# Patient Record
Sex: Female | Born: 1945 | Race: White | Hispanic: No | Marital: Married | Smoking: Never smoker
Health system: Southern US, Community
[De-identification: ages and names within clinical notes are randomized; demographics above are authoritative.]

## PROBLEM LIST (undated history)

## (undated) DIAGNOSIS — I2510 Atherosclerotic heart disease of native coronary artery without angina pectoris: Secondary | ICD-10-CM

## (undated) DIAGNOSIS — I4891 Unspecified atrial fibrillation: Secondary | ICD-10-CM

## (undated) DIAGNOSIS — E079 Disorder of thyroid, unspecified: Secondary | ICD-10-CM

---

## 2007-01-14 ENCOUNTER — Inpatient Hospital Stay (HOSPITAL_COMMUNITY): Admission: EM | Admit: 2007-01-14 | Discharge: 2007-01-16 | Payer: Self-pay | Admitting: Emergency Medicine

## 2008-03-06 ENCOUNTER — Ambulatory Visit: Payer: Self-pay

## 2009-06-20 ENCOUNTER — Ambulatory Visit: Payer: Self-pay | Admitting: Family Medicine

## 2009-07-06 IMAGING — CR DG ELBOW COMPLETE 3+V*R*
3 series · 3 of 3 positions shown · non-contrast
Comparison: none

CLINICAL DATA: Fall.  Pain.
 RIGHT ELBOW ? 4 VIEWS ? 01/14/07:

[x elbow joint lat right]
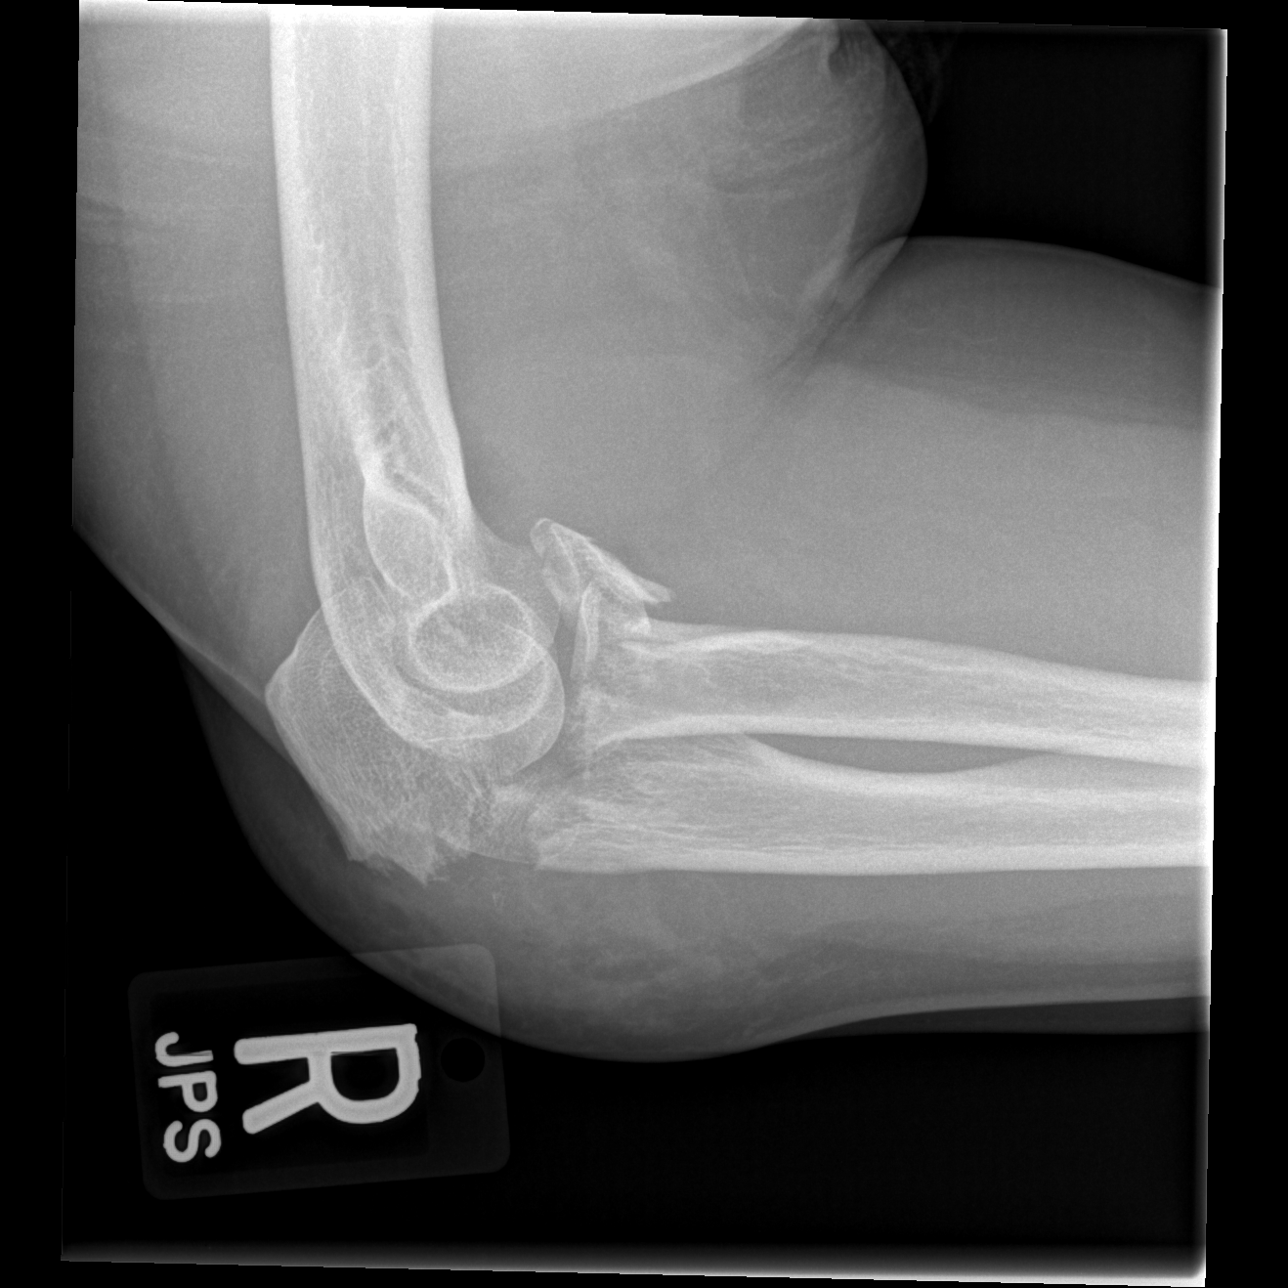

[x elbow joint ap right]
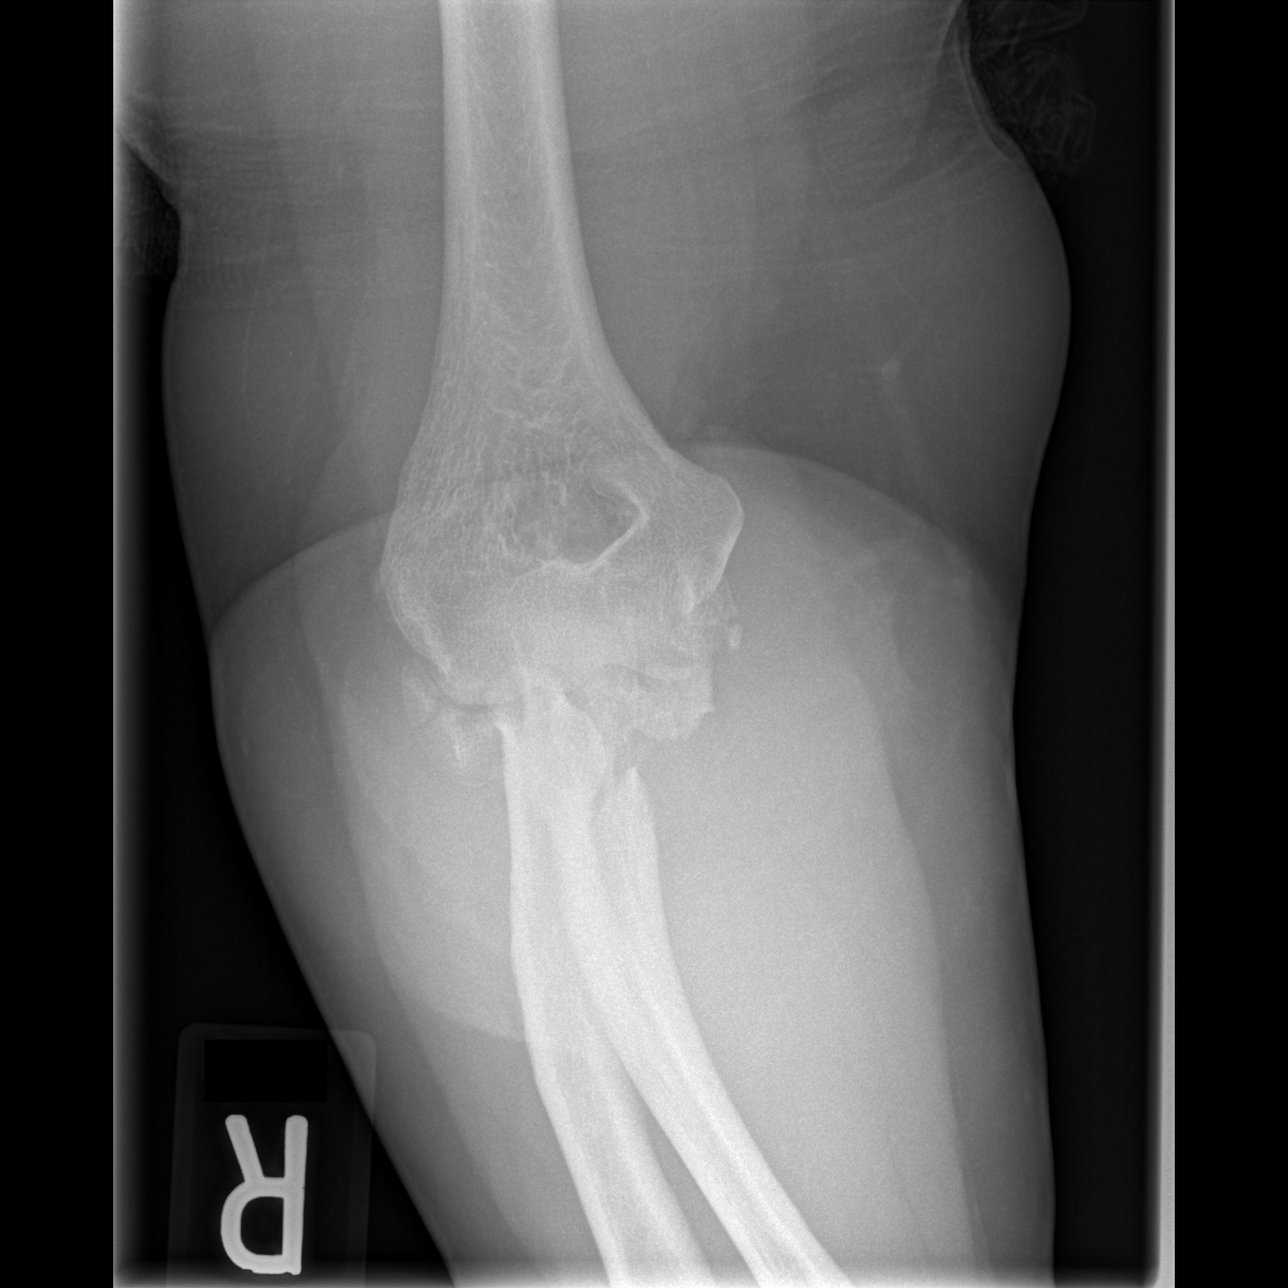

[x elbow joint obl. right]
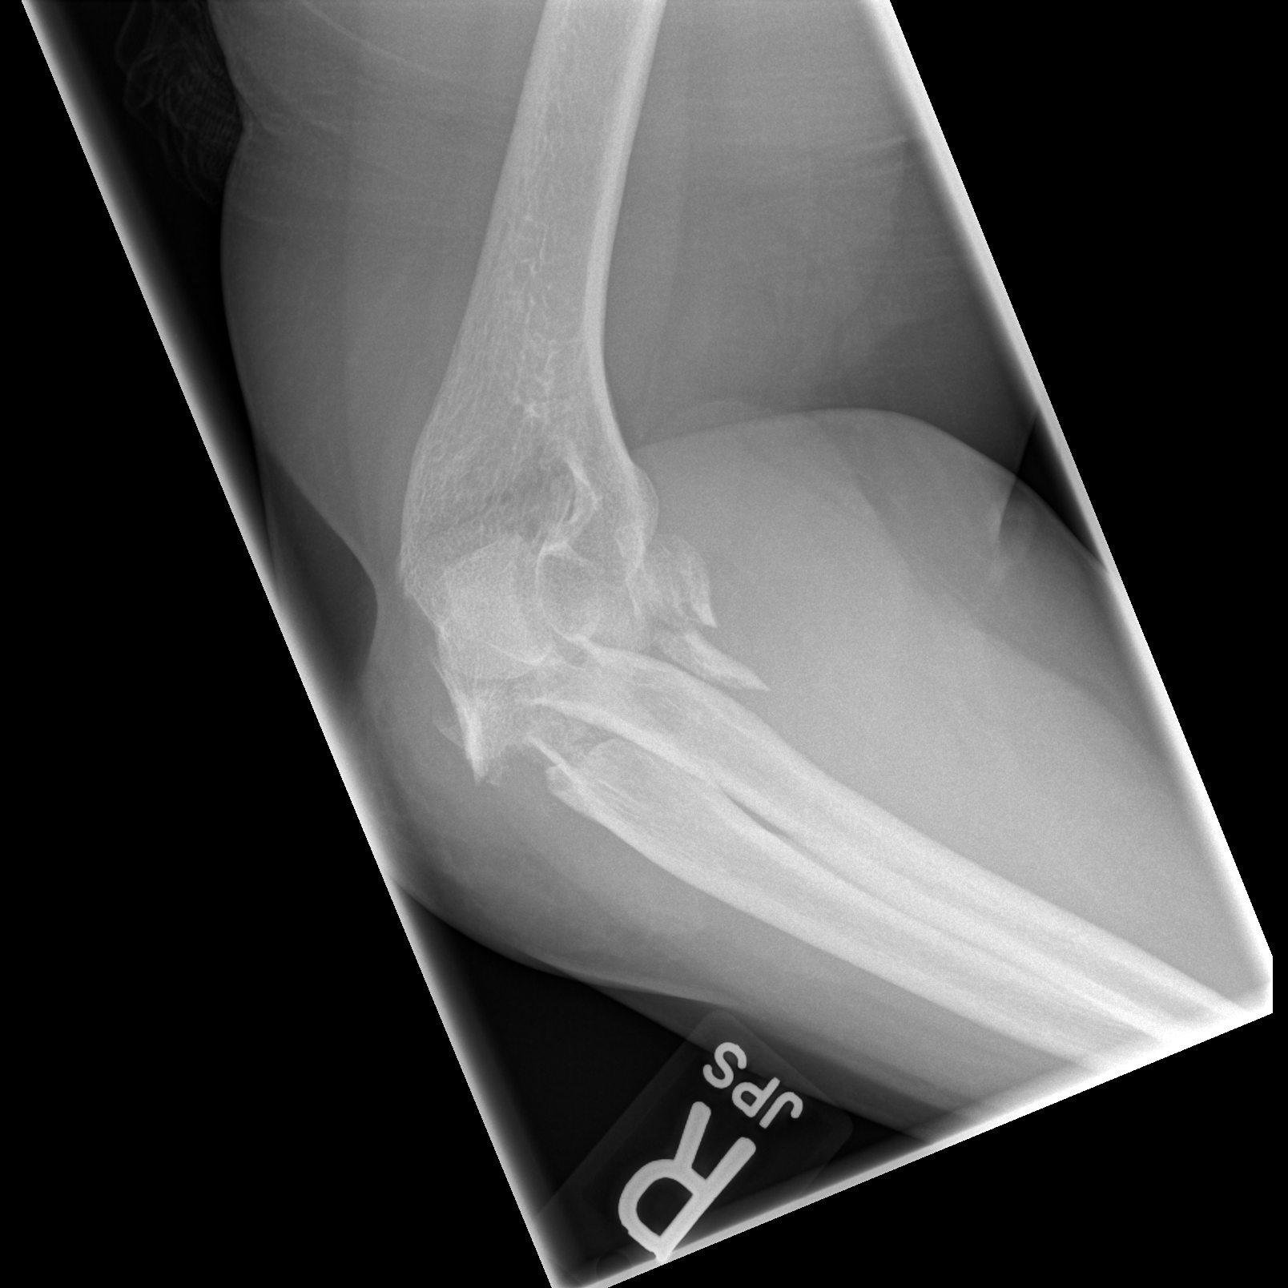

[3 of 3 positions shown; findings below may reference images not displayed]

FINDINGS: There are markedly comminuted fractures of the proximal right ulna and radius.  There is approximately 11 mm of distraction associated with the comminuted ulnar fracture.  The coronoid process of the ulna is displaced anteriorly with respect to the ulnar shaft.  In addition, the radial head fracture is markedly comminuted and impacted.
IMPRESSION: Markedly comminuted fractures of the proximal right radius and ulna with displacement as discussed above.

## 2010-05-24 NOTE — Discharge Summary (Signed)
[name], [name]                ACCOUNT NO.:  [id number]   MEDICAL RECORD NO.:  [id number]          PATIENT TYPE:  INP   LOCATION:  5037                         FACILITY:  [facility]   PHYSICIAN:  Madelynn Done, MD  DATE OF BIRTH:  [birth year]   DATE OF ADMISSION:  01/14/2007  DATE OF DISCHARGE:  01/16/2007                               DISCHARGE SUMMARY   ADMISSION DIAGNOSIS:  Right elbow, proximal ulna and proximal radius  fracture.   DISCHARGE DIAGNOSES:  1. Right proximal radius and ulna fracture.  2. Hypothyroidism.  3. Anxiety.   PROCEDURES AND DATES:  Open reduction and internal fixation right  proximal olecranon and radial head arthroplasty on January 14, 2007.   DISCHARGE MEDICATIONS:  1. Percocet 1-2 tablets p.o. q.4-6 hours p.r.n. pain.  2. Colace 100 mg p.o. b.i.d.  3. Multivitamin 1 tablet p.o. daily.  4. Phenergan 12.5 mg p.o. q.6 p.r.n. nausea.   REASON FOR ADMISSION:  The patient is a 65 year old female who was at  work and tripped and fell and landed on her right elbow.  She sustained  a significant injury to her right elbow with a fracture of the right  proximal olecranon, coronoid, and radial head.  The patient was admitted  for pain control and to undergo the above procedure.   HOSPITAL COURSE:  The patient was taken to the operating room on the day  of admission.  She underwent the above procedure under general  anesthesia.  She tolerated it well.  She was admitted postoperatively to  the orthopedic floor where she was begun on IV pain medications and IV  antibiotics.  The patient was examined throughout her hospital course  and noted to remain afebrile.  Her vital signs remained stable and  normal.  She was up ambulating without assistive devices.  Her pain was  controlled on oral pain medications.  She was tolerating a regular diet  and felt ready to be discharged to home on postoperative day #2.   RECOMMENDATIONS/DISPOSITION:  The patient will be  discharged to home  with her husband.  She is to continue with the above discharge  medications.  She is going to be seen back in the office in 12 days for  wound check, likely staple removal and placed into a long-arm cast.  I  plan to see her back.  She was given my contact information.  If she has  any problems, she is going to come back in sooner.  She has no use of  the right upper  extremity.  No return to work until I see her back in the office while  she is on the narcotic pain medications.  No driving, no lifting.  Prior  to her discharge, the patient's discharge instructions were explained to  her, her questions were answered, and the patient voiced understanding  the discharge instructions.      Madelynn Done, MD  Electronically Signed     FWO/MEDQ  D:  01/16/2007  T:  01/16/2007  Job:  [id number]

## 2010-05-24 NOTE — Op Note (Signed)
[name], [name]                ACCOUNT NO.:  [id number]   MEDICAL RECORD NO.:  [id number]          PATIENT TYPE:  INP   LOCATION:  5037                         FACILITY:  [facility]   PHYSICIAN:  Madelynn Done, MD  DATE OF BIRTH:  [birth year]   DATE OF PROCEDURE:  01/14/2007  DATE OF DISCHARGE:                               OPERATIVE REPORT   PREOPERATIVE DIAGNOSES:  1. Right elbow proximal ulna fracture.  2. Right ulna coronoid fracture.  3. Right radial head comminuted fracture, Mason type III fracture.  4. Monteggia type variant proximal elbow fracture   POSTOPERATIVE DIAGNOSES:  1. Right elbow proximal ulna fracture.  2. Right ulna coronoid fracture.  3. Right radial head comminuted fracture, Mason type III fracture.  4. Monteggia type variant proximal elbow fracture   ATTENDING SURGEON:  Dr. Bradly Bienenstock who was scrubbed and present for  the entire procedure.   ASSISTANT SURGEON:  Dr. Venita Lick who was scrubbed and present for  the key portions of the procedure.   SURGICAL PROCEDURES:  1. Open treatment of Monteggia type variant proxima ulna fracture with      internal fixation, internal fixation of proximal ulna and coronoid      fracture.  2. Right elbow radial head arthroplasty.  3. Right elbow ulnar nerve neurolysis, neuroplasty and decompression.  4. Stress radiography, three views, of right elbow.   ANESTHESIA:  General via endotracheal tube.   TOURNIQUET TIME:  2 hours at 250 mmHg.   IMPLANTS:  1. Acumed radial head arthroplasty; 26 mm, +0 head and 7-mm stem.  2. Acumed Right ulnar proximal olecranon plate; nine-hole locking      plate.   SURGICAL INDICATIONS:  [name] is a 65 year old right-hand-dominant  female who fell and landed on an outstretched right elbow.  She was seen  and evaluated in the emergency department and noted to have a displaced  proximal olecranon fracture of the proximal metaphysis as well as a  fracture of the coronoid  and comminuted displaced radial head fracture.  Given her injuries, it was recommended that she undergo surgical  fixation of her fractures.  I outlined the procedure with her.  I  discussed the risks of surgery to include, but not limited to bleeding,  infection, nerve damage, nonunion, malunion, hardware failure, loss of  motion of the elbow, need for further surgical intervention, implant  loosening, and persistent pain in the elbow.  A signed informed consent  was obtained on the day of surgery.   INTRAOPERATIVE FINDINGS:  The patient did have a comminuted radial head  fracture with 5 or more fragments.  It was not felt amenable to open  reduction and internal fixation, but more for radial head arthroplasty.  She did have a coronoid fracture.  There were several fracture lines  within the coronoid fracture involving the articular cartilage.  The  ulnar nerve was identified and protected throughout the entire case, but  it was felt that after the end of the case that it was not necessary to  transpose the nerve anteriorly.  There was not  any significant tension  on the nerve following repair of the fracture.   DESCRIPTION OF PROCEDURE:  The patient was identified in the preop  holding area and a mark with a permanent marker was made on the right  elbow to indicate the correct operative site.  The patient was then  brought back to the operating room and placed supine on the anesthesia  room table and general anesthesia was administered via endotracheal  tube.  The patient received preoperative antibiotics.  The patient was  then, using a beanbag positioner, placed in a lazy lateral position.  Sterile Foley catheter had been placed prior.  SCDs were placed for  mechanical DVT prophylaxis.  The patient was placed in the lazy lateral  position.  All pressure points were well padded.  After adequate  positioning, the right upper extremity was then sealed with a U drape  and then prepped  and draped in the usual sterile fashion.  A sterile  tourniquet was then applied.  The correct site was identified and the  surgical procedure was then begun.   The limb was then elevated using Esmarch exsanguination and tourniquet  insufflated to 250 mmHg.  Using a standard posterior approach to the  elbow and curving radially around the olecranon tip, dissection was  carried down through the skin and subcutaneous tissue and all the way  down to bone to expose the fracture site.  The fracture hematoma was  debrided of the olecranon and the patient did have  a significant soft  tissue envelope injury involving a portion of the common extensor muscle  off the lateral epicondyle as well as the anconeus.  This allowed for  good visualization of the proximal radioulnar joint as well as the  radial head.  Following exposure of the elbow joint, attention was first  turned to the coronoid fragment.  Two large #2-0 FiberWire sutures were  then placed after drill holes were made in the coronoid and then placed  in order to pull the coronoid from an anterior-to-posterior direction.  Portions of the capsule were included in these 2 large sutures.  These  sutures were then brought down and a trial reduction of the olecranon  was done.  The appeared to be good position of the coronoid with the  FiberWire suture in place.  After the sutures were then placed, the  ulnar nerve was in very close proximity to the coronoid fragment as it  traced medially from along the medial portion of the olecranon.  This  nerve was then decompressed distally and freed up to the edge of the  cubital tunnel.  It was not transposed anteriorly.  Following the  neurolysis of the ulnar nerve and temporary fixation of the coronoid,  attention was then turned to the radial head.  The radial head was in  more than 5 pieces and this was taken off to the back table and it was  felt that the radial head was amenable to radial head  arthroplasty.  The  canal was then entered.  The canal was then prepared in 1-mm increments  with the 6-mm and 7-mm canal preparers.  Following this, a trial implant  was then applied.  It was appropriately sized.  After broaching of the  proximal radial canal and trial implantation, a 26-mm head and +0, a 7-  mm stem was then implanted without any difficulty.  There was good  articulation of the radiocapitellar joint.  It did not appear the  radiocapitellar joint was overstuffed.  Following this and placement of  the radial head arthroplasty, attention was then turned to fixation of  the ulna.  A nine-hole Acumed proximal ulna plate was then fashioned to  the posterior surface of the olecranon.  It was temporarily fixed both  proximally and distally after reduction was obtained by open method.  Once fixation was done proximally and distally, the mini C-arm was then  used to confirm the reduction.  The FiberWire suture that had the  capsule and coronoid fragments was then brought from an anterior-to-  posterior direction and tied down directly over the bone and into and  over the plate.  There was concern about fixation with screws from an  anterior-to-posterior direction because of the coronoid fragment being  fractured in multiple planes and displacing the coronoid fracture even  further.  It did appear to reduce nicely with the suture fixation and  tying them down to the plate.  Following this, attention was then turned  to the proximal fixation where the remaining 5 screws were then placed  in the proximal portion the olecranon.  These were appropriately drilled  and measured.  There were 3 locking screws in the proximal plate and 1  nonlocking screw.  The remaining screws were then filled distally into  the plate with a combination of locking and nonlocking screws.  Following fixation of the ulna, the wound was then thoroughly irrigated.  Hemostasis was obtained with bipolar cautery.   Again, the ulnar nerve  was protected throughout and we did not feel it necessary to transpose  the nerve.  The FiberWire suture was then tied down over the plate and  around 2 of the screw holes.  The wound was irrigated once again.  The  lateral capsular region and the lateral extensor mass was then repaired  as well as the rent in the lateral portion of the triceps.  This was  repaired with a 0 Vicryl suture.  The medial exposure was also repaired  with 0 Vicryl figure-of-eight sutures.  The deep subcutaneous tissues  were closed with a 0 Vicryl suture.  Subcutaneous tissues were closed  with 2-0 Vicryl suture and the skin was then closed with staples.  Ten  mL of 0.5% Marcaine was infiltrated around the field.  A Xeroform  dressing was then applied and the patient was placed in a sterile  compressive dressing and a long-arm splint with the forearm in neutral  rotation.   She was then extubated and taken to recovery room in good condition.   INTRAOPERATIVE RADIOGRAPHS:  The patient was noted to have the radial  head arthroplasty and the ulnar plate fixation in place.  There appeared  to be good position in the screws as well as the coronoid fragment and  the radial head arthroplasty.  The elbow was placed through stress  radiography and there was good flexion-extension range of motion as well  as forearm rotation.  It did not have any areas of impingement nor did  it appear to have any screws penetrating the articular margin with good  reduction of the coronoid fragment.   POSTOPERATIVE PLAN:  The patient will be admitted overnight for IV  antibiotics and pain control to be discharged within the next 23-48  hours.  Continue long-arm immobilization for a total of likely 3 weeks  and then begin active motion at the 3-week mark.  I will see her back in  10 days and then go into  a long-arm cast and then back at the 3-week  mark.      Madelynn Done, MD  Electronically  Signed     FWO/MEDQ  D:  01/14/2007  T:  01/15/2007  Job:  [id number]

## 2010-09-29 LAB — BASIC METABOLIC PANEL
BUN: 8
CO2: 24
Calcium: 9
Chloride: 106
Creatinine, Ser: 0.83

## 2010-09-29 LAB — CBC
HCT: 39.4
MCV: 89.1
Platelets: 278
RDW: 14.1

## 2010-09-29 LAB — PROTIME-INR
INR: 0.9
Prothrombin Time: 12.8

## 2010-09-29 LAB — DIFFERENTIAL
Basophils Absolute: 0
Basophils Relative: 0
Eosinophils Absolute: 0
Eosinophils Relative: 0
Neutrophils Relative %: 85 — ABNORMAL HIGH

## 2010-12-14 ENCOUNTER — Ambulatory Visit: Payer: Self-pay | Admitting: Family Medicine

## 2013-06-05 IMAGING — CT CT ABD-PELV W/ CM
1 of 2 series · 15 of 32 positions shown, 19 images · non-contrast
Comparison: none

REASON FOR EXAM: Worsening abd pain  eval for diverticulitis
COMMENTS:

[Series 2: soft tissue · axial · 0.84mm/px · z∈[-129,+306]mm · 15 of 95 slices shown, 19 images]
[im 4/95  soft-tissue]
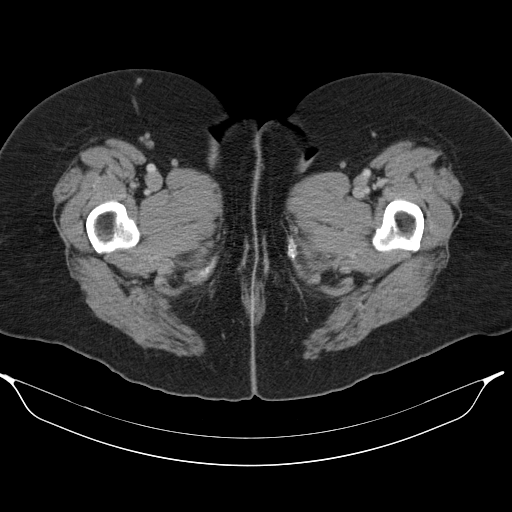
[im 4/95  bone]
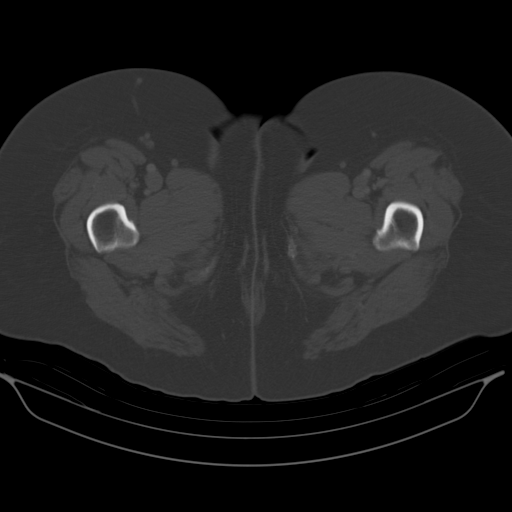
[im 12/95  soft-tissue]
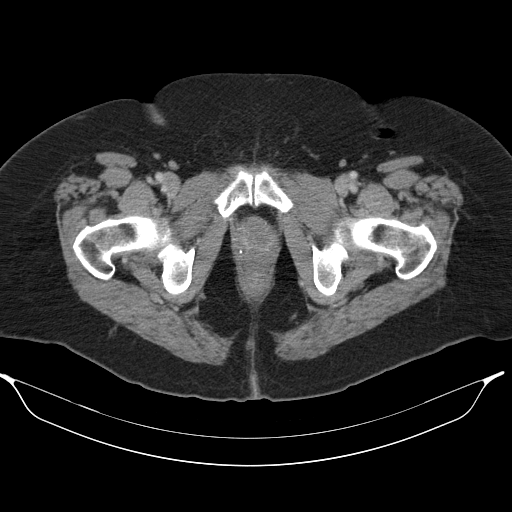
[im 20/95  soft-tissue]
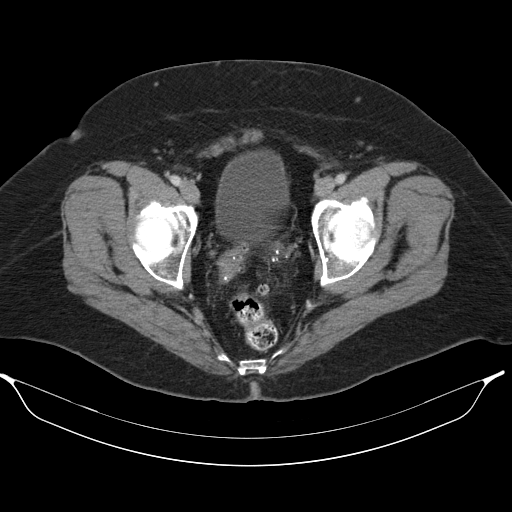
[im 28/95  soft-tissue]
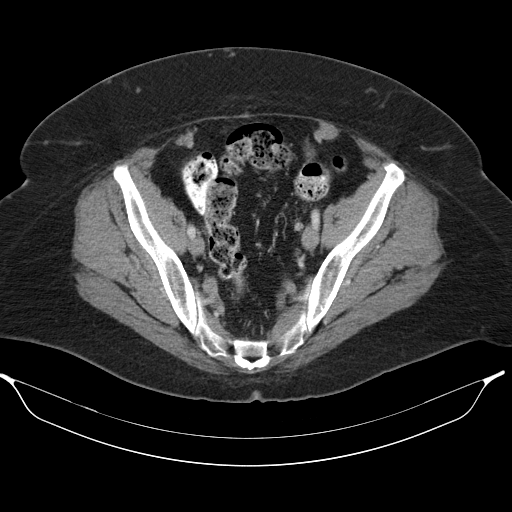
[im 32/95  soft-tissue]
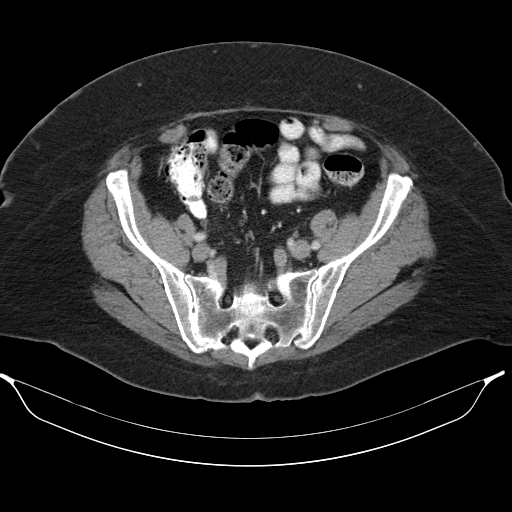
[im 40/95  soft-tissue]
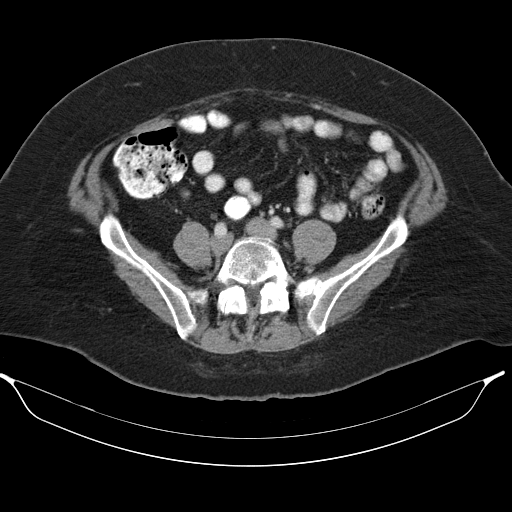
[im 48/95  soft-tissue]
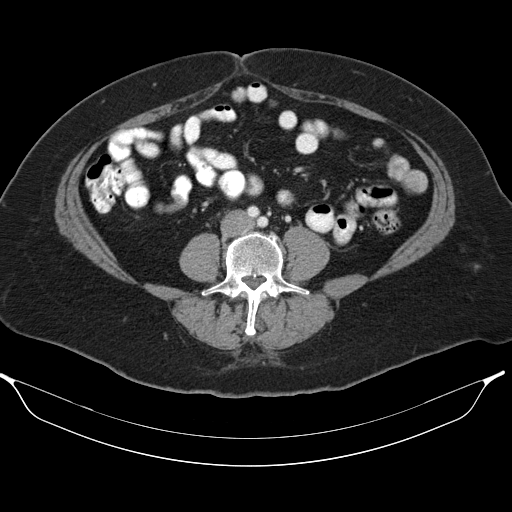
[im 55/95  soft-tissue]
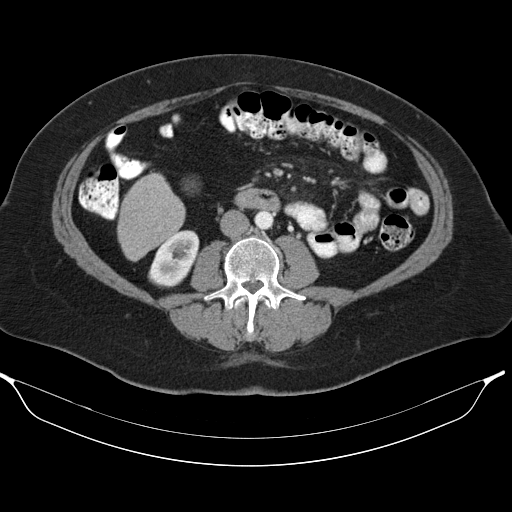
[im 63/95  soft-tissue]
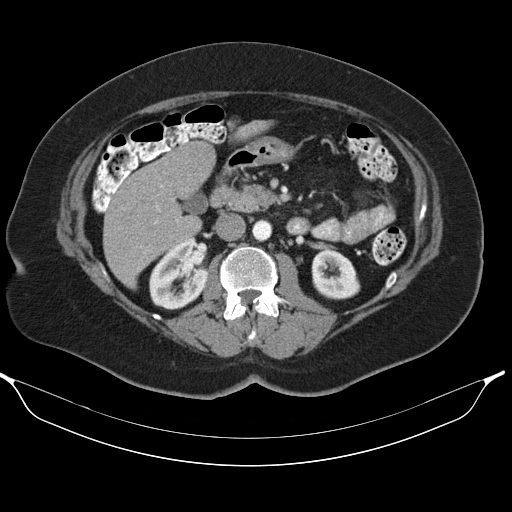
[im 63/95  bone]
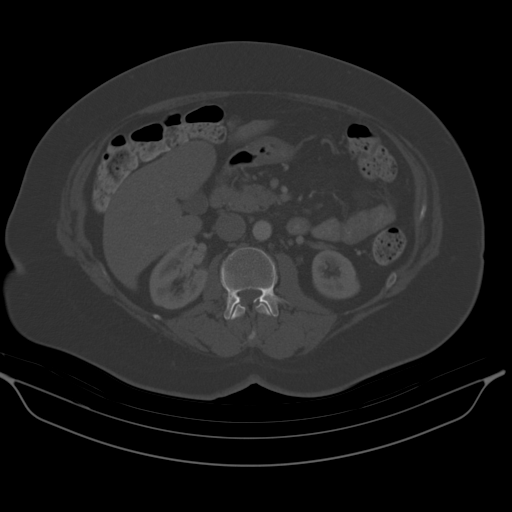
[im 67/95  soft-tissue]
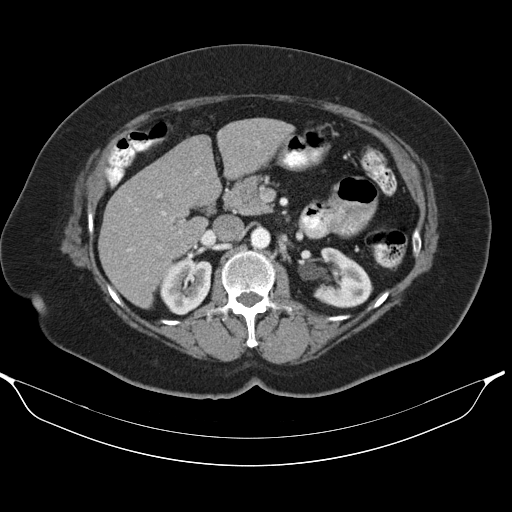
[im 75/95  soft-tissue]
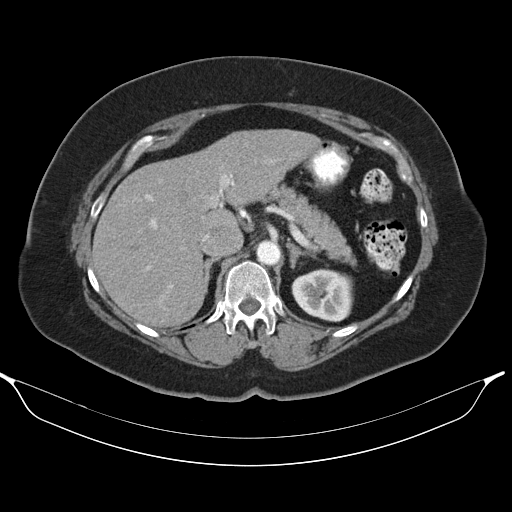
[im 79/95  lung]
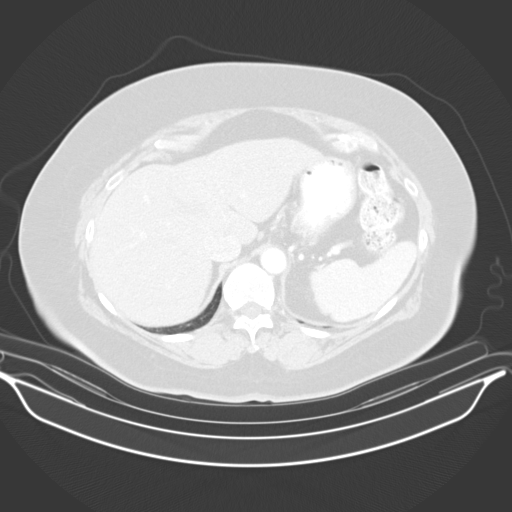
[im 83/95  soft-tissue]
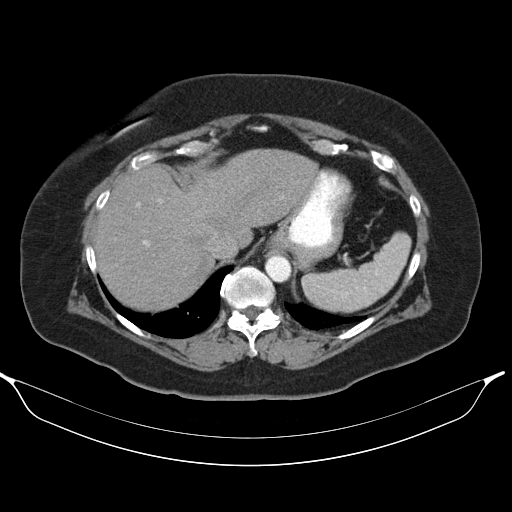
[im 83/95  lung]
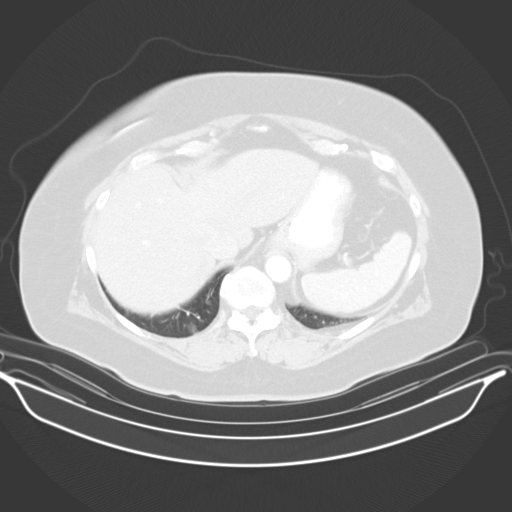
[im 87/95  lung]
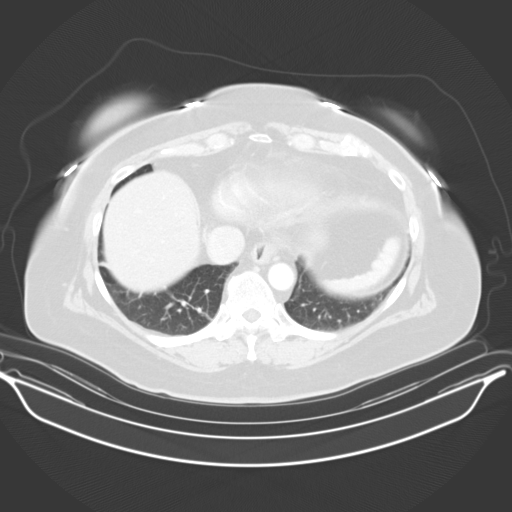
[im 91/95  soft-tissue]
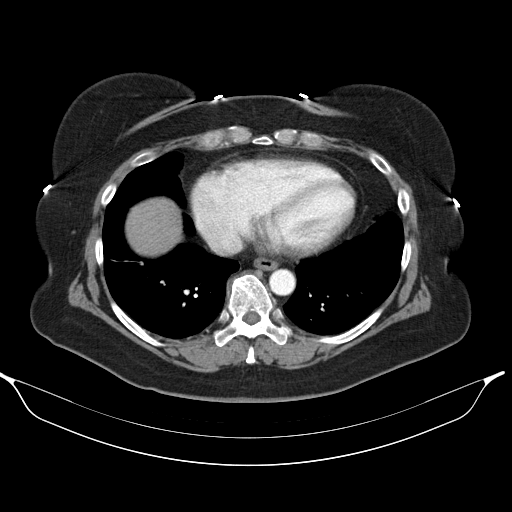
[im 91/95  lung]
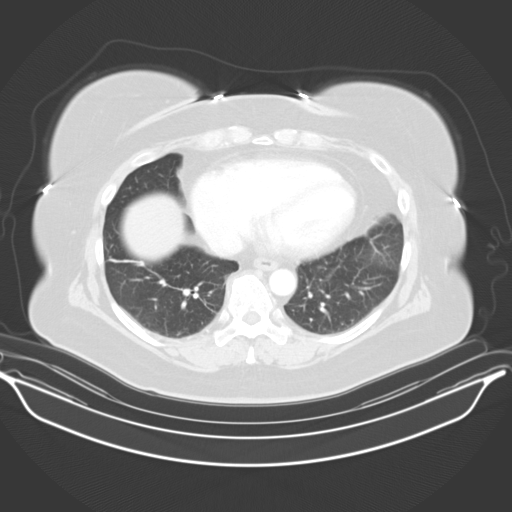

[15 of 32 positions shown; findings below may reference images not displayed]

PROCEDURE:     DAMIR - DAMIR ABDOMEN / PELVIS W  - December 14, 2010 [DATE]

RESULT:     Axial CT scanning was performed through the abdomen and pelvis
with reconstructions at 5 mm intervals and slice thicknesses following
intravenous administration of 100 cc of Hsovue-0CK. The patient also
received oral contrast material. Review of multiplanar reconstructed images
was performed separately on the VIA monitor.

The partially contrast-filled loops of small and large bowel exhibit no
evidence of ileus or obstruction or acute inflammation. The terminal ileum
is normal in appearance. The patient reportedly has undergone previous
appendectomy. No inflammatory changes in the right lower quadrant of the
abdomen are demonstrated.

The liver, gallbladder, pancreas, spleen, partially distended stomach,
adrenal glands, and kidneys are normal in appearance. A loop of the right
colon interposes itself between the anterior surface of the liver and the
inner aspect of the right anterior abdominal wall. In the appropriate
clinical setting this can be symptomatic. The caliber of the abdominal aorta
is normal. I see no periaortic nor pericaval lymphadenopathy.

Within the pelvis the partially distended urinary bladder is normal in
appearance. The uterus is surgically absent. I see no adnexal masses nor
free fluid.

The lung bases exhibit minimal atelectasis versus fibrosis. The lumbar
vertebral bodies are preserved in height.
IMPRESSION: 1. I do not see evidence of acute diverticulitis. There are scattered
diverticula involving predominantly the sigmoid colon without evidence of
inflammatory change. I see no inflammatory process in the right lower
quadrant of the abdomen or pelvis.
2. Interposition of the right colon between the anterior surface of the
liver and the right anterior abdominal wall may be symptomatic in the
appropriate clinical setting.
3. I see no acute hepatobiliary abnormality nor acute urinary tract
abnormality.

## 2016-01-04 ENCOUNTER — Encounter: Payer: Self-pay | Admitting: *Deleted

## 2016-01-04 ENCOUNTER — Ambulatory Visit
Admission: EM | Admit: 2016-01-04 | Discharge: 2016-01-04 | Disposition: A | Discharge status: Home / Self Care | Payer: Medicare Other | Attending: Internal Medicine | Admitting: Internal Medicine

## 2016-01-04 DIAGNOSIS — J069 Acute upper respiratory infection, unspecified: Secondary | ICD-10-CM | POA: Diagnosis not present

## 2016-01-04 HISTORY — DX: Atherosclerotic heart disease of native coronary artery without angina pectoris: I25.10

## 2016-01-04 HISTORY — DX: Disorder of thyroid, unspecified: E07.9

## 2016-01-04 HISTORY — DX: Unspecified atrial fibrillation: I48.91

## 2016-01-04 MED ORDER — CEFUROXIME AXETIL 250 MG PO TABS: ORAL_TABLET | ORAL | 0 refills | Status: AC

## 2016-01-04 MED ORDER — HYDROCOD POLST-CPM POLST ER 10-8 MG/5ML PO SUER: ORAL | 0 refills | Status: AC

## 2016-01-04 NOTE — ED Triage Notes (Signed)
Productive cough- green, facial pain and headache, x1 week. Seen here on 12/21, states worse today.

## 2016-01-04 NOTE — ED Provider Notes (Signed)
CSN: [id number]     Arrival date & time 01/04/16  1459 History   First MD Initiated Contact with Patient 01/04/16 1545     Chief Complaint  Patient presents with  . Cough  . Facial Pain  . Otalgia   (Consider location/radiation/quality/duration/timing/severity/associated sxs/prior Treatment) HPI  This is a 70 year old female who is visiting her daughter. She states that she flew in to the area on 1221. Prior to the flight she was having some upper respiratory symptoms particularly in her sinuses with drainage and pressure. Since she has arrived here it has worsened and is now causing her to have some laryngitis productive cough of brownish yellow green sputum feeling more fatigued. Her husband had very similar symptoms and received several antibiotic injections as well as a steroid injection while in [place]. Has not had any fever or chills.      Past Medical History:  Diagnosis Date  . Atrial fibrillation (HCC)   . Coronary artery disease   . Thyroid disease    History reviewed. No pertinent surgical history. Family History  Problem Relation Age of Onset  . Stroke Mother   . Diabetes Father   . Emphysema Father    Social History  Substance Use Topics  . Smoking status: Never Smoker  . Smokeless tobacco: Never Used  . Alcohol use No   OB History    No data available     Review of Systems  Constitutional: Positive for activity change. Negative for chills, diaphoresis, fatigue and fever.  HENT: Positive for congestion, postnasal drip, rhinorrhea, sinus pain, sinus pressure, sore throat and voice change.   Respiratory: Positive for cough. Negative for shortness of breath, wheezing and stridor.   All other systems reviewed and are negative.   Allergies  Penicillins; Codeine; Doxycycline; Epinephrine; Lidocaine; and Nsaids  Home Medications   Prior to Admission medications   Medication Sig Start Date End Date Taking? Authorizing Provider  apixaban (ELIQUIS) 5 MG TABS  tablet Take 5 mg by mouth 2 (two) times daily.   Yes Historical Provider, MD  Cholecalciferol (VITAMIN D3) 3000 units TABS Take by mouth.   Yes Historical Provider, MD  cyclobenzaprine (FLEXERIL) 5 MG tablet Take 5 mg by mouth 3 (three) times daily as needed for muscle spasms.   Yes Historical Provider, MD  levothyroxine (SYNTHROID, LEVOTHROID) 125 MCG tablet Take 125 mcg by mouth daily before breakfast.   Yes Historical Provider, MD  liothyronine (CYTOMEL) 5 MCG tablet Take 5 mcg by mouth daily.   Yes Historical Provider, MD  LORazepam (ATIVAN) 0.5 MG tablet Take 0.5 mg by mouth every 8 (eight) hours.   Yes Historical Provider, MD  propafenone (RYTHMOL SR) 325 MG 12 hr capsule Take 325 mg by mouth 2 (two) times daily.   Yes Historical Provider, MD  sertraline (ZOLOFT) 50 MG tablet Take 50 mg by mouth daily.   Yes Historical Provider, MD  acyclovir (ZOVIRAX) 200 MG capsule Take 200 mg by mouth 5 (five) times daily.    Historical Provider, MD  cefUROXime (CEFTIN) 250 MG tablet Take one tablet BID with food 01/04/16   Lutricia Feil, PA-C  chlorpheniramine-HYDROcodone [facility]) 10-8 MG/5ML SUER Take 1/2 teaspoon twice a day as necessary for cough 01/04/16   Lutricia Feil, PA-C  fluticasone (FLONASE) 50 MCG/ACT nasal spray Place into both nostrils daily.    Historical Provider, MD  furosemide (LASIX) 20 MG tablet Take 20 mg by mouth.    Historical Provider, MD  loratadine (  CLARITIN) 10 MG tablet Take 10 mg by mouth daily.    Historical Provider, MD  meclizine (ANTIVERT) 25 MG tablet Take 25 mg by mouth 3 (three) times daily as needed for dizziness.    Historical Provider, MD  mometasone (ELOCON) 0.1 % cream Apply 1 application topically daily.    Historical Provider, MD  rizatriptan (MAXALT) 5 MG tablet Take 5 mg by mouth as needed for migraine. May repeat in 2 hours if needed    Historical Provider, MD   Meds Ordered and Administered this Visit  Medications - No data to  display  BP 116/69 (BP Location: Left Arm)   Pulse 80   Temp 98.4 F (36.9 C) (Oral)   Resp 16   Ht 5\' 5"  (1.651 m)   Wt 264 lb (119.7 kg)   SpO2 96%   BMI 43.93 kg/m  No data found.   Physical Exam  Constitutional: She is oriented to person, place, and time. She appears well-developed and well-nourished. No distress.  HENT:  Head: Normocephalic and atraumatic.  Right Ear: External ear normal.  Left Ear: External ear normal.  Nose: Nose normal.  Mouth/Throat: Oropharynx is clear and moist. No oropharyngeal exudate.  Patient has tenderness to percussion over the maxillary sinuses  Eyes: EOM are normal. Pupils are equal, round, and reactive to light. Right eye exhibits no discharge. Left eye exhibits no discharge.  Neck: Normal range of motion. Neck supple.  Pulmonary/Chest: Effort normal and breath sounds normal. No respiratory distress. She has no wheezes. She has no rales.  Musculoskeletal: Normal range of motion.  Lymphadenopathy:    She has no cervical adenopathy.  Neurological: She is alert and oriented to person, place, and time.  Skin: Skin is warm and dry. She is not diaphoretic.  Psychiatric: She has a normal mood and affect. Her behavior is normal. Judgment and thought content normal.  Vitals reviewed.   Urgent Care Course   Clinical Course     Procedures (including critical care time)  Labs Review Labs Reviewed - No data to display  Imaging Review No results found.   Visual Acuity Review  Right Eye Distance:   Left Eye Distance:   Bilateral Distance:    Right Eye Near:   Left Eye Near:    Bilateral Near:         MDM   1. Upper respiratory tract infection, unspecified type    Discharge Medication List as of 01/04/2016  4:10 PM    START taking these medications   Details  cefUROXime (CEFTIN) 250 MG tablet Take one tablet BID with food, Normal    chlorpheniramine-HYDROcodone (TUSSIONEX PENNKINETIC ER) 10-8 MG/5ML SUER Take 1/2 teaspoon  twice a day as necessary for cough, Print      Plan: 1. Test/x-ray results and diagnosis reviewed with patient 2. rx as per orders; risks, benefits, potential side effects reviewed with patient 3. Recommend supportive treatment with Rest and fluids. Patient has a history of atrial fibrillation is currently taking Rythmol and Eliquis so use of a Z-Pak would not be indicated for her.  She is allergic to penicillins but has not had any reaction to cephalosporins to her knowledge. Start her on Ceftin and have advised her that if she has any adverse reactions such as itching or rash she should discontinue use and contact us. She is returning to [place] on January 4 and she will follow-up with her primary care when she arrives there. He has any problems in the interim she  may return to our clinic. She is currently taking using Flonase which I have encouraged her to continue as well as continue with Claritin use. I also prescribed Tussionex for a time use in a low dose of 1/2 teaspoon at nighttime for cough. Once again if she has any reaction she should discontinue to use and contact us 4. F/u prn if symptoms worsen or don't improve     Lutricia FeilWilliam P Gilbert Narain, PA-C 01/04/16 1621

## 2017-12-27 ENCOUNTER — Telehealth: Payer: Self-pay | Admitting: *Deleted

## 2017-12-27 NOTE — Telephone Encounter (Signed)
Patient called and wanted to let you know that she is in the hospital room number 135. Patient would like for you to come see her today if you get a chance.
